# Patient Record
Sex: Male | Born: 1985 | Hispanic: No | Marital: Married | Smoking: Never smoker
Health system: Southern US, Community
[De-identification: ages and names within clinical notes are randomized; demographics above are authoritative.]

## PROBLEM LIST (undated history)

## (undated) DIAGNOSIS — Z789 Other specified health status: Secondary | ICD-10-CM

## (undated) HISTORY — PX: ABDOMINAL SURGERY: SHX537

---

## 2005-03-21 ENCOUNTER — Ambulatory Visit: Payer: Self-pay | Admitting: Cardiology

## 2005-03-21 ENCOUNTER — Encounter: Payer: Self-pay | Admitting: Cardiology

## 2005-03-21 ENCOUNTER — Ambulatory Visit (HOSPITAL_COMMUNITY): Admission: RE | Admit: 2005-03-21 | Discharge: 2005-03-21 | Payer: Self-pay | Admitting: Internal Medicine

## 2014-08-16 ENCOUNTER — Ambulatory Visit (INDEPENDENT_AMBULATORY_CARE_PROVIDER_SITE_OTHER): Payer: No Typology Code available for payment source | Admitting: Physician Assistant

## 2014-08-16 VITALS — BP 122/72 | HR 70 | Temp 98.1°F | Resp 16 | Ht 63.0 in | Wt 157.2 lb

## 2014-08-16 DIAGNOSIS — L259 Unspecified contact dermatitis, unspecified cause: Secondary | ICD-10-CM | POA: Diagnosis not present

## 2014-08-16 MED ORDER — TRIAMCINOLONE ACETONIDE 0.1 % EX CREA: 1.0000 "application " | TOPICAL_CREAM | Freq: Two times a day (BID) | CUTANEOUS | Status: AC

## 2014-08-16 NOTE — Progress Notes (Signed)
[facility] [address], [place] [id number] [phone]  Date:  08/16/2014   Name:  Timothy Barker   DOB:  [birth year]   MRN:  [id number]  PCP:  No primary care provider on file.    Chief Complaint: Rash   History of Present Illness:  Timothy Barker is a 29 y.o. very pleasant male patient who presents with the following:  Brother is present with patient and speaks sufficient Shakyla Nolley.   Patient reports a pruritic rash at left arm.  He states that this has been here for one week.  It itches only, and does not weep, swell, or have bumps.  He notes that at first it was the size of the tip of his pinky.  As he scratched, the redness began to enlarge.   Patient works as a Designer, fashion/clothingroofer.  He denies any recent wooded activity in the outdoors.  He denies any trauma or bites.  He states that he recently was wearing a band on his left arm for one week.  This apparently holds cards.  He can not tell me the make of the material.  He does not have any other known allergy to any metals or material that he knows.  He does not wear a belt.     There are no active problems to display for this patient.   History reviewed. No pertinent past medical history.  History reviewed. No pertinent past surgical history.  History  Substance Use Topics  . Smoking status: Never Smoker   . Smokeless tobacco: Not on file  . Alcohol Use: Not on file    History reviewed. No pertinent family history.  No Known Allergies  Medication list has been reviewed and updated.  No current outpatient prescriptions on file prior to visit.   No current facility-administered medications on file prior to visit.    Review of Systems: ROS ottherwise unremarkable unless listed above.    Physical Examination: Filed Vitals:   08/16/14 1105  BP: 122/72  Pulse: 70  Temp: 98.1 F (36.7 C)  Resp: 16   Filed Vitals:   08/16/14 1105  Height: 5\' 3"  (1.6 m)  Weight: 157 lb 3.2 oz (71.305 kg)   Body mass  index is 27.85 kg/(m^2). Ideal Body Weight: Weight in (lb) to have BMI = 25: 140.8  Physical Exam  Constitutional: He is oriented to person, place, and time. He appears well-developed and well-nourished. No distress.  HENT:  Head: Normocephalic and atraumatic.  Eyes: EOM are normal. Pupils are equal, round, and reactive to light.  Cardiovascular: Normal rate.   Pulmonary/Chest: No respiratory distress. He has no wheezes.  Neurological: He is alert and oriented to person, place, and time.  Skin: Rash (Erythematous non-raised patch along flexor elbow region of lateral arm.  There are no papules, or vesicles.   Patch with very specific erythematous demarcation throughout.  ) noted. He is not diaphoretic.  Psychiatric: He has a normal mood and affect. His behavior is normal.     Assessment and Plan: 29 year old male is here today with a rash without signs of infection.  Offending agent not recognized, but treating with steroid cream BID.  Advised to rtc if rash does not improve in 1 week.    Contact dermatitis - Plan: triamcinolone cream (KENALOG) 0.1 %  Trena PlattStephanie Maikol Grassia, PA-C [facility] 3/7/201611:43 AM

## 2014-08-16 NOTE — Patient Instructions (Signed)
Timothy Barker (Contact Dermatitis) Timothy Barker l ph?n ?ng v?i cc ch?t nh?t ??nh ti?p Barker v?i da. Timothy Barker c th? l Timothy da do ti?p Barker v?i ch?t gy kch thch ho?c Timothy da ti?p xc do d? ?ng. Timothy da do ti?p Barker v?i ch?t gy kch thch khng c?n ti?p Barker tr??c ? v?i ch?t gy ra ph?n ?ng. Timothy da ti?p xc do d? ?ng ch? x?y ra n?u b?n ? ti?p Barker v?i ch?t ? tr??c ?y. Khi ti?p Barker l?p l?i, c? th? c?a b?n s? ph?n ?ng v?i ch?t ?. NGUYN NHN Nhi?u ch?t c th? gy ra Timothy Barker. Timothy da do ch?t kch thch l nguyn nhn ph? bi?n nh?t do ti?p Barker l?p ?i l?p l?i v?i ch?t kch thch nh?, ch?ng h?n nh?:  Trang ?i?m.  X phng.  Ch?t t?y r?a.  Ch?t t?y tr?ng.  Axit.  Mu?i kim lo?i, ch?ng h?n nh? niken. Timothy da ti?p xc do d? ?ng ph? bi?n nh?t l do ti?p Barker v?i:  Th?c v?t ??c.  Ha ch?t (ch?t kh? mi, d?u g?i).  ?? trang s?c.  Jerrilyn [place] su.  Neomycin trong kem khng sinh ba tc d?ng.  Ch?t b?o qu?n trong cc s?n ph?m, bao g?m c? qu?n o. TRI?U CH?NG Vng da b? ti?p Barker c th? pht tri?n:  Kh ho?c bong v?y.  ??.  N?t.  Ng?a.  ?au ho?c c?m gic b?ng rt.  M?n n??c. Khi b? Timothy da ti?p xc do d? ?ng, c?ng c th? b? s?ng trong nh?ng vng nh? m m?t, mi?ng ho?c b? ph?n sinh d?c. CH?N ?ON Chuyn gia ch?m [place] s?c kh?e c?a b?n th??ng c th? Barker ??nh v?n ?? b?ng cch khm th?c th?Suzzette Righter tr??ng h?p khng ch?c ch?n nguyn nhn v nghi ng? b?nh Timothy da ti?p xc do d? ?ng, xt nghi?m mi?ng da c th? ???c th?c hi?n ?? gip Barker ??nh nguyn nhn Timothy da. ?I?U TR? ?i?u tr? bao g?m b?o v? da kh?i ti?p Barker thm v?i ch?t kch thch b?ng cch trnh cc ch?t ? n?u c th?. B?t, kem ch?n v g?ng tay c th? h?u ch. Chuyn gia ch?m [place] s?c kh?e c?a b?n c?ng c th? ?? ngh?:  Thoa kem ho?c thu?c m? steroid 2 l?n m?i ngy. ?? c k?t qu? t?t nh?t, hy ngm vng pht ban vo n??c l?nh trong 20 pht. Sau ? thoa thu?c. Che vng ny b?ng mi?ng nh?a. B?n c th? b?o qu?n kem  steroid trong t? l?nh ?? c hi?u ?ng "l?nh" cho ch? pht ban c?a b?n. Cch ? c th? lm gi?m ng?a. Thu?c steroid dng ?? u?ng c th? c?n thi?t trong tr??ng h?p n?ng.  Thu?c khng sinh ho?c thu?c m? khng khu?n n?u xu?t hi?n nhi?m trng da.  Dung d?ch thu?c khng histamin ho?c thu?c khng histamin d?ng u?ng ?? gi?m b?t ng?a.  D?u ?? gi? ?? ?m trong da.  Dung d?ch burow ?? gi?m t?y ?? v ?au nh?c ho?c ?? lm kh ban ?ang r? n??c. Tr?n m?t gi ho?c vin dung d?ch trong 2 chn n??c l?nh. Nhng m?t chi?c kh?n s?ch vo h?n h?p, v?t m?t cht v ??t n ln vng b? ?nh h??ng. ?? kh?n t?i ch? trong 30 pht. Lm ?i?u ny cng nhi?u l?n cng t?t trong su?t c? ngy.  T?m b?t b?p ho?c so?a bicacbonat hng ngy n?u vng ny l qu l?n ?? che b?ng kh?n m?t. Ha ch?t m?nh,  ch?ng h?n nh? ch?t ki?m ho?c axit, c th? gy t?n th??ng da gi?ng nh? b?ng. B?n c?n x? n??c vo da mnh t? 15 ??n 20 pht b?ng n??c l?nh sau khi ti?p Barker. B?n c?ng nn ?i khm ngay l?p t?c sau khi ti?p Barker. B?ng, thu?c khng sinh v thu?c gi?m ?au c th? c?n thi?t cho da b? kch thch n?ng. H??NG D?N CH?M [place] T?I NH  Trnh ch?t gy ra ph?n ?ng.  Gi? cho vng da b? ?nh h??ng trnh xa n??c nng, x phng, nh sng m?t tr?i, ha ch?t, cc ch?t c tnh axit ho?c b?t c? th? g khc c th? gy kch ?ng da c?a b?n.  Khng gi ch? pht ban. Gi c th? khi?n cho ch? pht ban b? nhi?m trng.  B?n c th? t?m n??c mt ?? gip gi?m ng?a.  Ch? s? d?ng thu?c khng c?n k toa ho?c thu?c c?n k toa theo ch? d?n c?a chuyn gia ch?m [place] s?c kh?e.  G?p chuyn gia ch?m [place] s?c kh?e c?a b?n ?? khm l?i theo ch? d?n ?? ??m b?o da c?a b?n li?n ?ng cch. HY ?I KHM N?U:  Tnh tr?ng c?a b?n khng c?i thi?n sau 3 ngy ?i?u tr?Marland Kitchen.  B?n d??ng nh? tr? nn t? h?n.  B?n th?y c d?u hi?u nhi?m trng nh? s?ng, nh?y c?m ?au, ??, ?au nh?c ho?c ?m ? vng b? ?nh h??ng.  B?n c b?t k? v?n ?? no lin quan ??n thu?c. Document Released: 03/07/2005 Document Revised:  01/28/2013 [facility] Patient Information 2015 [place], [place]. This information is not intended to replace advice given to you by your health care provider. Make sure you discuss any questions you have with your health care provider.

## 2014-10-01 ENCOUNTER — Ambulatory Visit (INDEPENDENT_AMBULATORY_CARE_PROVIDER_SITE_OTHER): Payer: No Typology Code available for payment source | Admitting: Urgent Care

## 2014-10-01 VITALS — BP 124/76 | HR 63 | Temp 98.2°F | Resp 17 | Ht 63.5 in | Wt 154.0 lb

## 2014-10-01 DIAGNOSIS — R0981 Nasal congestion: Secondary | ICD-10-CM | POA: Diagnosis not present

## 2014-10-01 DIAGNOSIS — J309 Allergic rhinitis, unspecified: Secondary | ICD-10-CM | POA: Diagnosis not present

## 2014-10-01 DIAGNOSIS — R51 Headache: Secondary | ICD-10-CM

## 2014-10-01 DIAGNOSIS — R519 Headache, unspecified: Secondary | ICD-10-CM

## 2014-10-01 LAB — POCT CBC
GRANULOCYTE PERCENT: 67.7 % (ref 37–80)
HEMATOCRIT: 45.6 % (ref 43.5–53.7)
Hemoglobin: 15.1 g/dL (ref 14.1–18.1)
Lymph, poc: 2.9 (ref 0.6–3.4)
MCH: 25.4 pg — AB (ref 27–31.2)
MCHC: 33 g/dL (ref 31.8–35.4)
MCV: 76.8 fL — AB (ref 80–97)
MID (CBC): 0.3 (ref 0–0.9)
MPV: 8.1 fL (ref 0–99.8)
PLATELET COUNT, POC: 244 10*3/uL (ref 142–424)
POC Granulocyte: 6.7 (ref 2–6.9)
POC LYMPH PERCENT: 28.9 %L (ref 10–50)
POC MID %: 3.4 %M (ref 0–12)
RBC: 5.94 M/uL (ref 4.69–6.13)
RDW, POC: 14.3 %
WBC: 9.9 10*3/uL (ref 4.6–10.2)

## 2014-10-01 MED ORDER — FLUTICASONE PROPIONATE 50 MCG/ACT NA SUSP: 2.0000 | Freq: Every day | NASAL | Status: DC

## 2014-10-01 MED ORDER — CETIRIZINE HCL 10 MG PO TABS: 10.0000 mg | ORAL_TABLET | Freq: Every day | ORAL | Status: DC

## 2014-10-01 MED ORDER — PSEUDOEPHEDRINE HCL ER 120 MG PO TB12: 120.0000 mg | ORAL_TABLET | Freq: Two times a day (BID) | ORAL | Status: DC

## 2014-10-01 NOTE — Progress Notes (Signed)
    MRN: [id number] DOB: 1986/01/25  Subjective:   Timothy Barker is a 29 y.o. male presenting for chief complaint of Nasal Congestion; Cough; and Headache  Reports 1 month history of nasal congestion, sneezing, headache, subjective fever. Has tried otc nasal spray (cannot recall name) with minimal relief. Denies sinus pain, itchy or watery red eyes, ear pain, ear fullness, throat pain, chest pain, chest tightness, shob, wheezing, n/v, abdominal pain, diarrhea, myalgia, fatigue. Has not had any sick contacts. Denies history of seasonal allergies or asthma. Did not take flu shot this season. Denies smoking or alcohol use. Denies any other aggravating or relieving factors, no other questions or concerns.  Raheem currently has no medications in their medication list. He has No Known Allergies.  Rahil  has no past medical history on file. Also  has no past surgical history on file.  ROS As in subjective.  Objective:   Vitals: BP 124/76 mmHg  Pulse 63  Temp(Src) 98.2 F (36.8 C) (Oral)  Resp 17  Ht 5' 3.5" (1.613 m)  Wt 154 lb (69.854 kg)  BMI 26.85 kg/m2  SpO2 100%  Physical Exam  Constitutional: He is well-developed, well-nourished, and in no distress.  HENT:  TM's flat bilaterally, no effusions or erythema. Nasal turbinates boggy, inflamed and edematous with yellow-white mucus. Frontal sinus tenderness. Postnasal drip present, without oropharyngeal exudates, erythema or abscesses.  Eyes: Right eye exhibits no discharge. Left eye exhibits no discharge. No scleral icterus.  Conjunctiva slightly injected bilaterally.  Neck: Normal range of motion.  Cardiovascular: Normal rate, regular rhythm and intact distal pulses.  Exam reveals no gallop and no friction rub.   No murmur heard. Pulmonary/Chest: No stridor. No respiratory distress. He has no wheezes. He has no rales. He exhibits no tenderness.  Lymphadenopathy:    He has no cervical adenopathy.   Results for orders placed or  performed in visit on 10/01/14 (from the past 24 hour(s))  POCT CBC     Status: Abnormal   Collection Time: 10/01/14 12:15 PM  Result Value Ref Range   WBC 9.9 4.6 - 10.2 K/uL   Lymph, poc 2.9 0.6 - 3.4   POC LYMPH PERCENT 28.9 10 - 50 %L   MID (cbc) 0.3 0 - 0.9   POC MID % 3.4 0 - 12 %M   POC Granulocyte 6.7 2 - 6.9   Granulocyte percent 67.7 37 - 80 %G   RBC 5.94 4.69 - 6.13 M/uL   Hemoglobin 15.1 14.1 - 18.1 g/dL   HCT, POC 40.945.6 81.143.5 - 53.7 %   MCV 76.8 (A) 80 - 97 fL   MCH, POC 25.4 (A) 27 - 31.2 pg   MCHC 33.0 31.8 - 35.4 g/dL   RDW, POC 91.414.3 %   Platelet Count, POC 244 142 - 424 K/uL   MPV 8.1 0 - 99.8 fL   Assessment and Plan :   1. Nasal congestion 2. Sinus headache 3. Allergic rhinitis, unspecified allergic rhinitis type - Will start allergy management, advised that if he does not get better in 1 week to call and let me know, we can start antibiotic then for sinusitis likely due to uncontrolled allergies but for now will manage conservatively  Wallis BambergMario Zofia Peckinpaugh, PA-C [facility] [phone] 10/01/2014 11:37 AM

## 2014-10-01 NOTE — Patient Instructions (Addendum)
B?nh s?t mùa c? khô °(Hay Fever) °B?nh s?t mùa c? khô là ph?n ?ng d? ?ng v?i các h?t trong không khí. B?nh không lây t? ng??i sang ng??i. Không th? ch?a kh?i h?n b?nh này nh?ng có th? ki?m soát ???c.  °NGUYÊN NHÂN °B?nh s?t mùa c? khô gây b?i cái gì ?ó làm kh?i phát ph?n ?ng d? ?ng (d? nguyên). Sau ?ây là nh?ng ví d? v? các d? nguyên: °· C? ph?n h??ng. °· Lông. °· Gàu c?a ??ng v?t. °· C? và ph?n hoa. °· Khói thu?c lá. °· B?i trong nhà. °· Ô nhi?m. °TRI?U CH?NG °· H?t h?i. °· Ch?y n??c m?i ho?c ng?t m?i. °· Ch?y n??c m?t. °· M?t, m?i, mi?ng, h?ng, da ho?c khu v?c khác b? ng?a. °· ?au h?ng. °· ?au ??u. °· Gi?m c?m giác kh?u giác ho?c v? giác. °CH?N ?OÁN °Chuyên gia ch?m sóc s?c kh?e s? khám th?c th? và h?i v? các tri?u ch?ng b?n ?ang có. Xét nghi?m v? d? ?ng có th? ???c th?c hi?n ?? xác ??nh chính xác nguyên nhân kh?i phát b?nh s?t c? mùa khô c?a b?n. °?I?U TR? °· Thu?c không c?n kê toa có th? giúp gi?m các tri?u ch?ng. Các thu?c này bao g?m: °¨ Thu?c kháng histamine. °¨ Thu?c gi?m ng?t m?i. Thu?c này có th? giúp gi?m ngh?t m?i. °· N?u nh?ng thu?c không c?n kê toa không có tác d?ng, chuyên gia ch?m sóc s?c kh?e có th? kê ??n các lo?i thu?c khác. °· M?t s? ng??i h??ng l?i t? vi?c chích ng?a d? ?ng khi các lo?i thu?c khác không có tác d?ng. °H??NG D?N CH?M SÓC T?I NHÀ °· Tránh d? nguyên gây ra các tri?u ch?ng c?a b?n, n?u có th?. °· S? d?ng t?t c? thu?c theo ch? d?n c?a chuyên gia ch?m sóc s?c kh?e. °HÃY ?I KHÁM N?U: °· B?n có các tri?u ch?ng d? ?ng nghiêm tr?ng và các lo?i thu?c hi?n t?i c?a b?n không có tác d?ng. °· Vi?c ?i?u tr? có lúc có hi?u qu?, nh?ng bây gi? b?n ?ang có các tri?u ch?ng. °· B?n b? sung huy?t và b? áp l?c trong xoang. °· B?n b? s?t ho?c ?au ??u. °· B?n b? ch?y n??c m?i ??c. °· B?n b? hen suy?n và b? ho và th? khò khè n?ng thêm. °HÃY NGAY L?P T?C ?I KHÁM N?U: °· B?n b? s?ng l??i ho?c môi. °· B?n b? khó th?. °· B?n c?m th?y choáng váng ho?c c?m th?y nh? s?p ng?t. °· B?n ?? m? hôi l?nh. °· B?n b?  s?t. °Document Released: 05/28/2005 Document Revised: 01/28/2013 °ExitCare® Patient Information ©2015 ExitCare, LLC. This information is not intended to replace advice given to you by your health care provider. Make sure you discuss any questions you have with your health care provider. ° °

## 2019-04-29 ENCOUNTER — Other Ambulatory Visit: Payer: Self-pay

## 2019-04-29 ENCOUNTER — Encounter (HOSPITAL_COMMUNITY): Payer: Self-pay

## 2019-04-29 ENCOUNTER — Ambulatory Visit (HOSPITAL_COMMUNITY)
Admission: EM | Admit: 2019-04-29 | Discharge: 2019-04-29 | Disposition: A | Discharge status: Home / Self Care | Payer: No Typology Code available for payment source | Attending: Family Medicine | Admitting: Family Medicine

## 2019-04-29 DIAGNOSIS — R1012 Left upper quadrant pain: Secondary | ICD-10-CM | POA: Diagnosis not present

## 2019-04-29 LAB — POCT URINALYSIS DIP (DEVICE)
Bilirubin Urine: NEGATIVE
Glucose, UA: NEGATIVE mg/dL
Ketones, ur: NEGATIVE mg/dL
Leukocytes,Ua: NEGATIVE
Nitrite: NEGATIVE
Protein, ur: NEGATIVE mg/dL
Specific Gravity, Urine: >=1.03 (ref 1.005–1.030)
Urobilinogen, UA: 0.2 mg/dL (ref 0.0–1.0)
pH: 5.5 (ref 5.0–8.0)

## 2019-04-29 MED ORDER — POLYETHYLENE GLYCOL 3350 17 G PO PACK: 17.0000 g | PACK | Freq: Every day | ORAL | 0 refills | Status: DC

## 2019-04-29 MED ORDER — DOCUSATE SODIUM 100 MG PO CAPS: 100.0000 mg | ORAL_CAPSULE | Freq: Two times a day (BID) | ORAL | 0 refills | Status: DC

## 2019-04-29 NOTE — ED Triage Notes (Addendum)
Pt states he has abdominal pian. Pt states he has a little bite of a bowel movement. This ahs been going on for 3 days.

## 2019-04-29 NOTE — Discharge Instructions (Signed)
Please use Miralax for moderate to severe constipation. Take this once a day for the next 2-3 days. Please also start docusate stool softener, twice a day for at least 1 week. If stools become loose, cut down to once a day for another week. If stools remain loose, cut back to 1 pill every other day for a third week. You can stop docusate thereafter and resume as needed for constipation. ° °To help reduce constipation and promote bowel health: °1. Drink at least 64 ounces of water each day °2. Eat plenty of fiber (fruits, vegetables, whole grains, legumes) °3. Be physically active or exercise including walking, jogging, swimming, yoga, etc. °4. For active constipation use a stool softener (docusate) or an osmotic laxative (like Miralax) each day, or as needed.  °

## 2019-04-29 NOTE — ED Provider Notes (Signed)
[facility]    CSN: [id number] Arrival date & time: 04/29/19  1007      History   Chief Complaint Chief Complaint  Patient presents with  . Abdominal Pain    HPI Timothy Barker is a 33 y.o. male is significant past medical history presenting today for evaluation of abdominal pain.  Patient states that he has had left lower abdominal pain for the past 3 days.  He rates pain 6 out of 10.  He denies associated nausea or vomiting.  Symptoms began after drinking Sprite.  Does feel worsening symptoms after eating.  Described pain as a bloated sensation.  He denies any diarrhea.  Has had daily bowel movements, but states that they have been smaller than normal.  Denies straining.  Denies history of constipation.  Denies previous surgeries on abdomen.  Does feel pain in his back, but does not radiate to the groin.  Denies testicular pain or swelling.  Denies any urinary symptoms of dysuria, hematuria.  Denies fevers.  HPI  History reviewed. No pertinent past medical history.  There are no active problems to display for this patient.   History reviewed. No pertinent surgical history.     Home Medications    Prior to Admission medications   Medication Sig Start Date End Date Taking? Authorizing Provider  cetirizine (ZYRTEC) 10 MG tablet Take 1 tablet (10 mg total) by mouth daily. 10/01/14   Wallis Bamberg, PA-C  docusate sodium (COLACE) 100 MG capsule Take 1 capsule (100 mg total) by mouth every 12 (twelve) hours. 04/29/19   Wieters, Hallie C, PA-C  fluticasone (FLONASE) 50 MCG/ACT nasal spray Place 2 sprays into both nostrils daily. 10/01/14   Wallis Bamberg, PA-C  polyethylene glycol (MIRALAX / GLYCOLAX) 17 g packet Take 17 g by mouth daily. 04/29/19   Wieters, Hallie C, PA-C  pseudoephedrine (SUDAFED 12 HOUR) 120 MG 12 hr tablet Take 1 tablet (120 mg total) by mouth 2 (two) times daily. 10/01/14   Wallis Bamberg, PA-C    Family History Family History  Problem Relation Age of Onset   . Healthy Mother   . Healthy Father     Social History Social History   Tobacco Use  . Smoking status: Never Smoker  . Smokeless tobacco: Never Used  Substance Use Topics  . Alcohol use: Never    Alcohol/week: 0.0 standard drinks    Frequency: Never  . Drug use: Never     Allergies   Patient has no known allergies.   Review of Systems Review of Systems  Constitutional: Negative for fever.  HENT: Negative for sore throat.   Respiratory: Negative for shortness of breath.   Cardiovascular: Negative for chest pain.  Gastrointestinal: Positive for abdominal pain and constipation. Negative for nausea and vomiting.  Genitourinary: Negative for difficulty urinating, discharge, dysuria, frequency, penile pain, penile swelling, scrotal swelling and testicular pain.  Skin: Negative for rash.  Neurological: Negative for dizziness, light-headedness and headaches.     Physical Exam Triage Vital Signs ED Triage Vitals  Enc Vitals Group     BP 04/29/19 1024 126/77     Pulse Rate 04/29/19 1024 78     Resp 04/29/19 1024 16     Temp 04/29/19 1024 98.6 F (37 C)     Temp Source 04/29/19 1024 Oral     SpO2 04/29/19 1024 100 %     Weight 04/29/19 1041 145 lb (65.8 kg)     Height --  Head Circumference --      Peak Flow --      Pain Score 04/29/19 1041 4     Pain Loc --      Pain Edu? --      Excl. in GC? --    No data found.  Updated Vital Signs BP 126/77 (BP Location: Left Arm)   Pulse 78   Temp 98.6 F (37 C) (Oral)   Resp 16   Wt 145 lb (65.8 kg)   SpO2 100%   BMI 25.28 kg/m   Visual Acuity Right Eye Distance:   Left Eye Distance:   Bilateral Distance:    Right Eye Near:   Left Eye Near:    Bilateral Near:     Physical Exam Vitals signs and nursing note reviewed.  Constitutional:      Appearance: He is well-developed.     Comments: No acute distress, sitting comfortably on exam table  HENT:     Head: Normocephalic and atraumatic.  Eyes:      Conjunctiva/sclera: Conjunctivae normal.  Neck:     Musculoskeletal: Neck supple.  Cardiovascular:     Rate and Rhythm: Normal rate and regular rhythm.     Heart sounds: No murmur.  Pulmonary:     Effort: Pulmonary effort is normal. No respiratory distress.     Breath sounds: Normal breath sounds.     Comments: Breathing comfortably at rest, CTABL, no wheezing, rales or other adventitious sounds auscultated Abdominal:     Palpations: Abdomen is soft.     Tenderness: There is abdominal tenderness.     Comments: Abdomen soft, nondistended, bowel sounds present throughout all 4 quadrants, dull to percussion in upper abdomen, tender to palpation in left lower quadrant, negative rebound, negative Rovsing, negative McBurney, no CVA tenderness  Musculoskeletal:     Comments: Ambulating normally, moving around easily without appearing uncomfortable  Skin:    General: Skin is warm and dry.  Neurological:     Mental Status: He is alert.      UC Treatments / Results  Labs (all labs ordered are listed, but only abnormal results are displayed) Labs Reviewed  POCT URINALYSIS DIP (DEVICE) - Abnormal; Notable for the following components:      Result Value   Hgb urine dipstick TRACE (*)    All other components within normal limits    EKG   Radiology No results found.  Procedures Procedures (including critical care time)  Medications Ordered in UC Medications - No data to display  Initial Impression / Assessment and Plan / UC Course  I have reviewed the triage vital signs and the nursing notes.  Pertinent labs & imaging results that were available during my care of the patient were reviewed by me and considered in my medical decision making (see chart for details).     UA with negative leuks and nitrites, is trace hemoglobin, symptoms most consistent with constipation.  Did not suspect obstruction at this time as he is continuing to move bowels.  Recommended lifestyle changes of  increasing water, activity and fiber.  Provided MiraLAX and Colace to use for the next week and then titrate down as needed.  Continue to monitor,Discussed strict return precautions. Patient verbalized understanding and is agreeable with plan.  Final Clinical Impressions(s) / UC Diagnoses   Final diagnoses:  Left upper quadrant abdominal pain     Discharge Instructions     Please use Miralax for moderate to severe constipation. Take this once a day  for the next 2-3 days. Please also start docusate stool softener, twice a day for at least 1 week. If stools become loose, cut down to once a day for another week. If stools remain loose, cut back to 1 pill every other day for a third week. You can stop docusate thereafter and resume as needed for constipation.  To help reduce constipation and promote bowel health: 1. Drink at least 64 ounces of water each day 2. Eat plenty of fiber (fruits, vegetables, whole grains, legumes) 3. Be physically active or exercise including walking, jogging, swimming, yoga, etc. 4. For active constipation use a stool softener (docusate) or an osmotic laxative (like Miralax) each day, or as needed.   ED Prescriptions    Medication Sig Dispense Auth. Provider   polyethylene glycol (MIRALAX / GLYCOLAX) 17 g packet Take 17 g by mouth daily. 14 each Wieters, Hallie C, PA-C   docusate sodium (COLACE) 100 MG capsule Take 1 capsule (100 mg total) by mouth every 12 (twelve) hours. 60 capsule Wieters, [place] C, PA-C     PDMP not reviewed this encounter.   Janith Lima, PA-C 04/29/19 1124

## 2020-03-09 ENCOUNTER — Emergency Department (HOSPITAL_COMMUNITY): Payer: No Typology Code available for payment source

## 2020-03-09 ENCOUNTER — Encounter (HOSPITAL_COMMUNITY): Payer: Self-pay | Admitting: Emergency Medicine

## 2020-03-09 ENCOUNTER — Emergency Department (HOSPITAL_COMMUNITY)
Admission: EM | Admit: 2020-03-09 | Discharge: 2020-03-10 | Disposition: A | Discharge status: Home / Self Care | Payer: No Typology Code available for payment source | Attending: Emergency Medicine | Admitting: Emergency Medicine

## 2020-03-09 DIAGNOSIS — R091 Pleurisy: Secondary | ICD-10-CM | POA: Insufficient documentation

## 2020-03-09 LAB — BASIC METABOLIC PANEL
Anion gap: 10 (ref 5–15)
BUN: 13 mg/dL (ref 6–20)
CO2: 24 mmol/L (ref 22–32)
Calcium: 8.9 mg/dL (ref 8.9–10.3)
Chloride: 100 mmol/L (ref 98–111)
Creatinine, Ser: 0.94 mg/dL (ref 0.61–1.24)
GFR calc Af Amer: >60 mL/min (ref >60)
GFR calc non Af Amer: >60 mL/min (ref >60)
Glucose, Bld: 129 mg/dL — ABNORMAL HIGH (ref 70–99)
Potassium: 3.6 mmol/L (ref 3.5–5.1)
Sodium: 134 mmol/L — ABNORMAL LOW (ref 135–145)

## 2020-03-09 LAB — TROPONIN I (HIGH SENSITIVITY): Troponin I (High Sensitivity): 3 ng/L (ref <18)

## 2020-03-09 LAB — CBC
HCT: 42.7 % (ref 39.0–52.0)
Hemoglobin: 14.3 g/dL (ref 13.0–17.0)
MCH: 25.3 pg — ABNORMAL LOW (ref 26.0–34.0)
MCHC: 33.5 g/dL (ref 30.0–36.0)
MCV: 75.4 fL — ABNORMAL LOW (ref 80.0–100.0)
Platelets: 266 10*3/uL (ref 150–400)
RBC: 5.66 MIL/uL (ref 4.22–5.81)
RDW: 14.2 % (ref 11.5–15.5)
WBC: 7.7 10*3/uL (ref 4.0–10.5)
nRBC: 0 % (ref 0.0–0.2)

## 2020-03-09 NOTE — ED Triage Notes (Signed)
Pt here from home with c/o right side chest pain and sob that radiates to his back on going for 2 weeks

## 2020-03-09 NOTE — ED Notes (Signed)
Called for vitals and no response 

## 2020-03-10 LAB — D-DIMER, QUANTITATIVE: D-Dimer, Quant: <0.27 ug/mL-FEU (ref 0.00–0.50)

## 2020-03-10 LAB — TROPONIN I (HIGH SENSITIVITY): Troponin I (High Sensitivity): 4 ng/L (ref <18)

## 2020-03-10 MED ORDER — IBUPROFEN 800 MG PO TABS
800.0000 mg | ORAL_TABLET | Freq: Once | ORAL | Status: AC
  Administered 2020-03-10: 800 mg via ORAL
  Filled 2020-03-10: qty 1

## 2020-03-10 MED ORDER — IBUPROFEN 800 MG PO TABS: 800.0000 mg | ORAL_TABLET | Freq: Three times a day (TID) | ORAL | 0 refills | Status: DC | PRN

## 2020-03-10 NOTE — Discharge Instructions (Addendum)
Follow-up with your family doctor in 1 to 2 weeks if not improving 

## 2020-03-10 NOTE — ED Provider Notes (Signed)
[facility] EMERGENCY DEPARTMENT Provider Note   CSN: [id number] Arrival date & time: 03/09/20  1310     History No chief complaint on file.   Timothy Barker is a 34 y.o. male.  Patient complains of right-sided chest pain is worse with inspiration for a number days.  No fever sweating cough  The history is provided by the patient. No language interpreter was used.  Chest Pain Pain location:  R chest Pain quality: aching   Pain radiates to:  Does not radiate Pain severity:  Moderate Onset quality:  Sudden Timing:  Constant Progression:  Waxing and waning Chronicity:  New Context: breathing   Relieved by:  Nothing Associated symptoms: no abdominal pain, no back pain, no cough, no fatigue and no headache        History reviewed. No pertinent past medical history.  There are no problems to display for this patient.   History reviewed. No pertinent surgical history.     Family History  Problem Relation Age of Onset  . Healthy Mother   . Healthy Father     Social History   Tobacco Use  . Smoking status: Never Smoker  . Smokeless tobacco: Never Used  Substance Use Topics  . Alcohol use: Never    Alcohol/week: 0.0 standard drinks  . Drug use: Never    Home Medications Prior to Admission medications   Medication Sig Start Date End Date Taking? Authorizing Provider  cetirizine (ZYRTEC) 10 MG tablet Take 1 tablet (10 mg total) by mouth daily. 10/01/14   Wallis Bamberg, PA-C  docusate sodium (COLACE) 100 MG capsule Take 1 capsule (100 mg total) by mouth every 12 (twelve) hours. 04/29/19   Wieters, Hallie C, PA-C  fluticasone (FLONASE) 50 MCG/ACT nasal spray Place 2 sprays into both nostrils daily. 10/01/14   Wallis Bamberg, PA-C  ibuprofen (ADVIL) 800 MG tablet Take 1 tablet (800 mg total) by mouth every 8 (eight) hours as needed for moderate pain. 03/10/20   Bethann Berkshire, MD  polyethylene glycol (MIRALAX / GLYCOLAX) 17 g packet Take 17 g by mouth daily.  04/29/19   Wieters, Hallie C, PA-C  pseudoephedrine (SUDAFED 12 HOUR) 120 MG 12 hr tablet Take 1 tablet (120 mg total) by mouth 2 (two) times daily. 10/01/14   Wallis Bamberg, PA-C    Allergies    Patient has no known allergies.  Review of Systems   Review of Systems  Constitutional: Negative for appetite change and fatigue.  HENT: Negative for congestion, ear discharge and sinus pressure.   Eyes: Negative for discharge.  Respiratory: Negative for cough.   Cardiovascular: Positive for chest pain.  Gastrointestinal: Negative for abdominal pain and diarrhea.  Genitourinary: Negative for frequency and hematuria.  Musculoskeletal: Negative for back pain.  Skin: Negative for rash.  Neurological: Negative for seizures and headaches.  Psychiatric/Behavioral: Negative for hallucinations.    Physical Exam Updated Vital Signs BP 135/84   Pulse 89   Temp 98.2 F (36.8 C) (Oral)   Resp 20   SpO2 100%   Physical Exam Vitals and nursing note reviewed.  Constitutional:      Appearance: He is well-developed.  HENT:     Head: Normocephalic.     Nose: Nose normal.  Eyes:     General: No scleral icterus.    Conjunctiva/sclera: Conjunctivae normal.  Neck:     Thyroid: No thyromegaly.  Cardiovascular:     Rate and Rhythm: Normal rate and regular rhythm.  Heart sounds: No murmur heard.  No friction rub. No gallop.   Pulmonary:     Breath sounds: No stridor. No wheezing or rales.  Chest:     Chest wall: No tenderness.  Abdominal:     General: There is no distension.     Tenderness: There is no abdominal tenderness. There is no rebound.  Musculoskeletal:        General: Normal range of motion.     Cervical back: Neck supple.  Lymphadenopathy:     Cervical: No cervical adenopathy.  Skin:    Findings: No erythema or rash.  Neurological:     Mental Status: He is alert and oriented to person, place, and time.     Motor: No abnormal muscle tone.     Coordination: Coordination  normal.  Psychiatric:        Behavior: Behavior normal.     ED Results / Procedures / Treatments   Labs (all labs ordered are listed, but only abnormal results are displayed) Labs Reviewed  BASIC METABOLIC PANEL - Abnormal; Notable for the following components:      Result Value   Sodium 134 (*)    Glucose, Bld 129 (*)    All other components within normal limits  CBC - Abnormal; Notable for the following components:   MCV 75.4 (*)    MCH 25.3 (*)    All other components within normal limits  D-DIMER, QUANTITATIVE (NOT AT [facility])  TROPONIN I (HIGH SENSITIVITY)  TROPONIN I (HIGH SENSITIVITY)    EKG EKG Interpretation  Date/Time:  Wednesday March 09 2020 13:26:52 EDT Ventricular Rate:  82 PR Interval:  152 QRS Duration: 94 QT Interval:  346 QTC Calculation: 404 R Axis:   52 Text Interpretation: Normal sinus rhythm Nonspecific T wave abnormality Abnormal ECG No old tracing to compare Confirmed by Marily Memos [phone]) on 03/10/2020 2:26:50 AM   Radiology DG Chest 2 View  Result Date: 03/09/2020 CLINICAL DATA:  Chest pain and shortness of breath EXAM: CHEST - 2 VIEW COMPARISON:  None. FINDINGS: There is minimal left base atelectasis. The lungs elsewhere are clear. Heart is upper normal in size with pulmonary vascularity normal. No adenopathy. No bone lesions. IMPRESSION: Slight left base atelectasis. Lungs otherwise clear. Heart upper normal in size. Electronically Signed   By: Bretta Bang III M.D.   On: 03/09/2020 13:50    Procedures Procedures (including critical care time)  Medications Ordered in ED Medications  ibuprofen (ADVIL) tablet 800 mg (800 mg Oral Given 03/10/20 1017)    ED Course  I have reviewed the triage vital signs and the nursing notes.  Pertinent labs & imaging results that were available during my care of the patient were reviewed by me and considered in my medical decision making (see chart for details).    MDM Rules/Calculators/A&P                          Labs and chest x-ray EKG all unremarkable.  Patient symptoms improved with Motrin.  Suspect pleuritis.  He will follow-up as needed and is given Motrin for discomfort      This patient presents to the ED for concern of chest pain, this involves an extensive number of treatment options, and is a complaint that carries with it a high risk of complications and morbidity.  The differential diagnosis includes PE MI   Lab Tests:   I Ordered, reviewed, and interpreted labs, which included CBC chemistries  D-dimer all unremarkable  Medicines ordered:   I ordered medication Motrin for pain  Imaging Studies ordered:   I ordered imaging studies which included chest x-ray  I independently visualized and interpreted imaging which showed negative  Additional history obtained:   Additional history obtained from record  Previous records obtained and reviewed.  Consultations Obtained:     Reevaluation:  After the interventions stated above, I reevaluated the patient and found improvement  Critical Interventions:  .   Final Clinical Impression(s) / ED Diagnoses Final diagnoses:  Pleuritis    Rx / DC Orders ED Discharge Orders         Ordered    ibuprofen (ADVIL) 800 MG tablet  Every 8 hours PRN        03/10/20 1152           Bethann Berkshire, MD 03/10/20 1158

## 2020-03-10 NOTE — ED Notes (Signed)
D/C instructions reviewed with pt. Pt verbalized understanding of DC instructions. PT DC

## 2021-05-20 IMAGING — DX DG CHEST 2V
2 series · 2 of 2 positions shown · non-contrast
Comparison: None.

CLINICAL DATA: Chest pain and shortness of breath

EXAM:
CHEST - 2 VIEW

[chest pa]
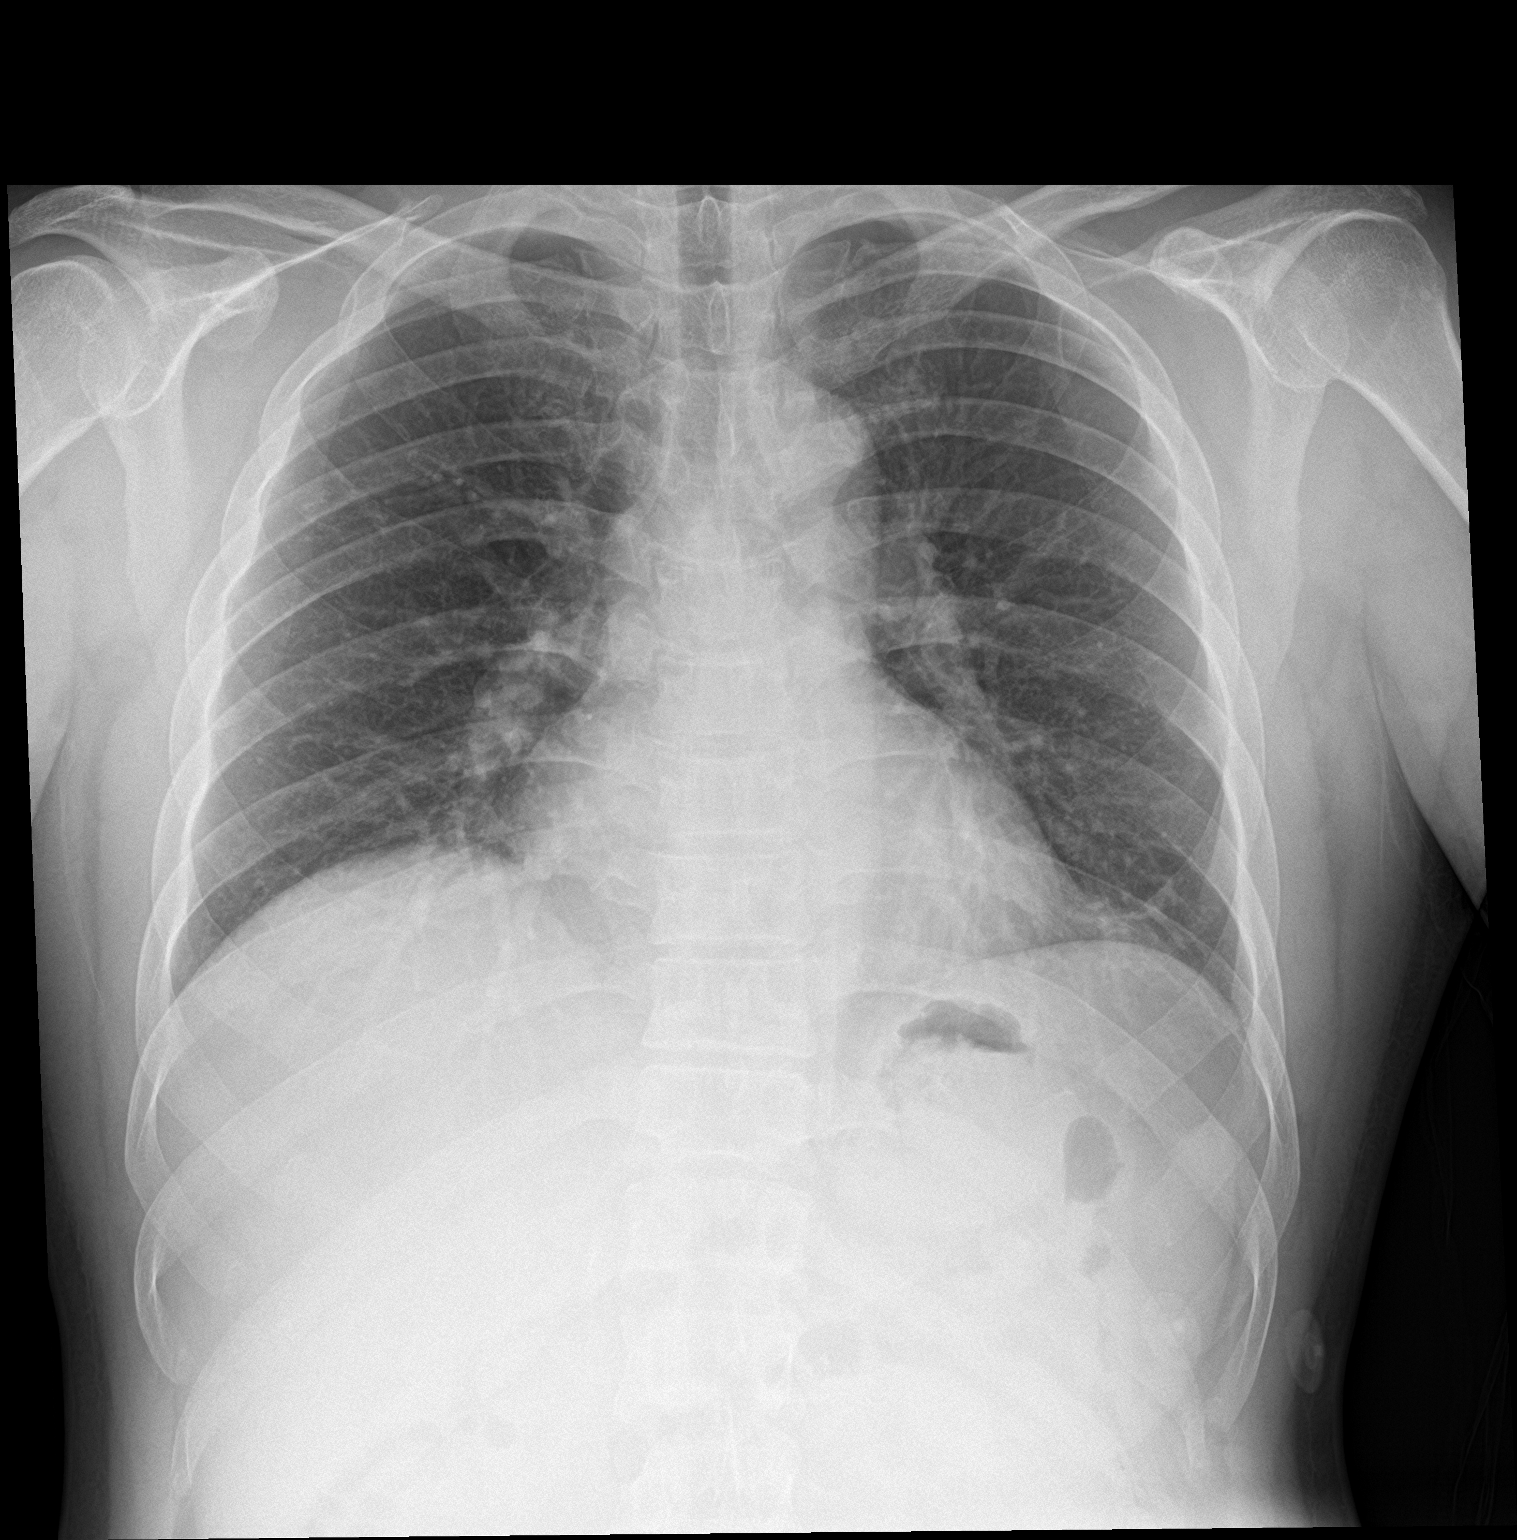

[chest lat]
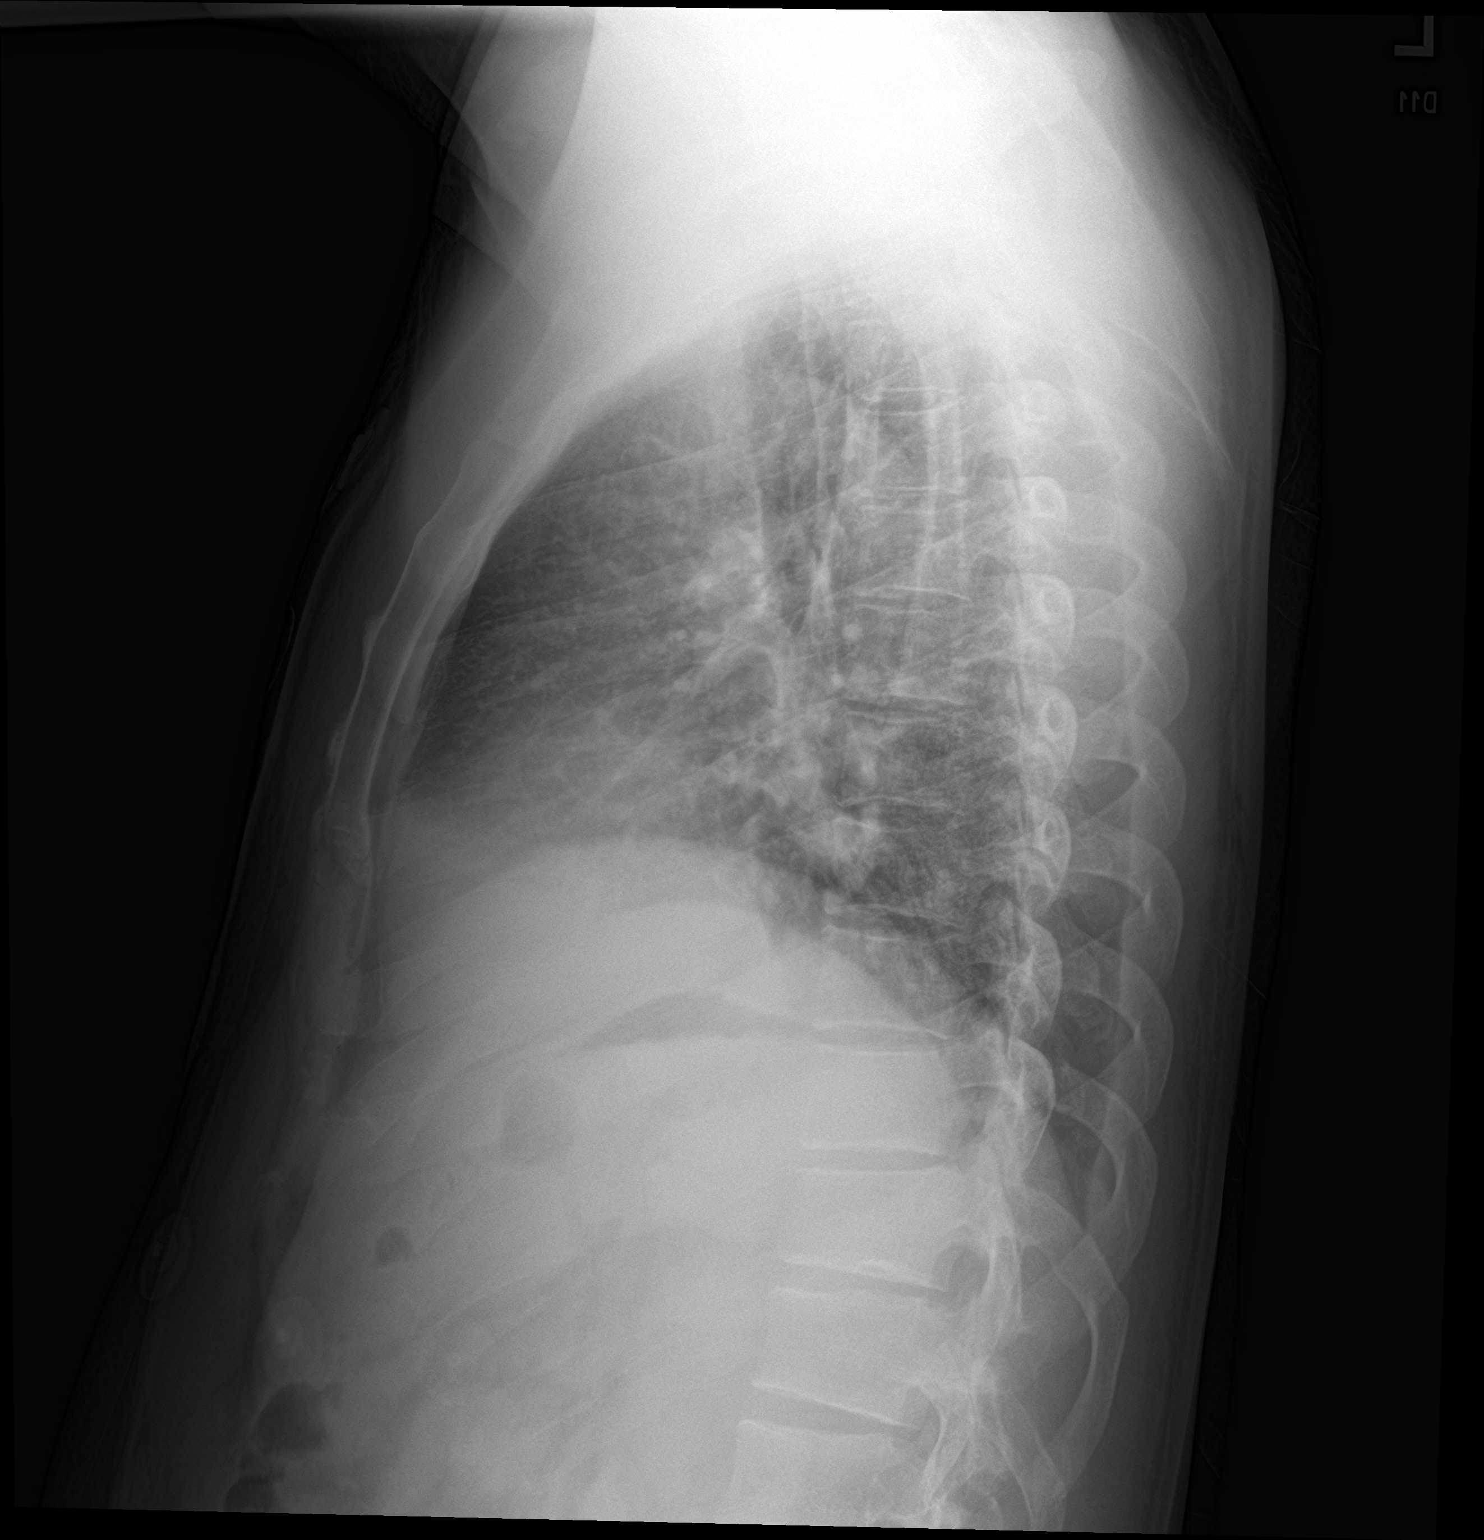

[2 of 2 positions shown; findings below may reference images not displayed]

FINDINGS: There is minimal left base atelectasis. The lungs elsewhere are
clear. Heart is upper normal in size with pulmonary vascularity
normal. No adenopathy. No bone lesions.
IMPRESSION: Slight left base atelectasis. Lungs otherwise clear. Heart upper
normal in size.

## 2023-02-14 ENCOUNTER — Other Ambulatory Visit: Payer: Self-pay | Admitting: Neurological Surgery

## 2023-02-18 ENCOUNTER — Encounter (HOSPITAL_COMMUNITY): Payer: Self-pay | Admitting: Neurological Surgery

## 2023-02-18 ENCOUNTER — Other Ambulatory Visit: Payer: Self-pay | Admitting: Neurological Surgery

## 2023-02-18 NOTE — Progress Notes (Signed)
SDW call  Patient's brother, Timothy Barker was given pre-op instructions over the phone. He is fluent in [place] and verbalized understanding of instructions provided.     PCP - Denies Cardiologist -  Pulmonary:    PPM/ICD - denies Device Orders - n/a Rep Notified - n/a   Chest x-ray - n/a EKG -  n/a Stress Test - ECHO -  Cardiac Cath -   Sleep Study/sleep apnea/CPAP: denies  Non-daibetic   Blood Thinner Instructions: denies Aspirin Instructions:denies   ERAS Protcol - NPO  COVID TEST- n/a    Anesthesia review: No  Your procedure is scheduled on Tuesday February 19, 2023  Report to [facility] Main Entrance "A" at  0820  A.M., then check in with the Admitting office.  Call this number if you have problems the morning of surgery:  [phone]   If you have any questions prior to your surgery date call [phone]: Open Monday-Friday 8am-4pm If you experience any cold or flu symptoms such as cough, fever, chills, shortness of breath, etc. between now and your scheduled surgery, please notify us at the above number     Remember:  Do not eat or drink after midnight the night before your surgery  Take these medicines the morning of surgery with A SIP OF WATER: None  As of today, STOP taking any Aspirin (unless otherwise instructed by your surgeon) Aleve, Naproxen, Ibuprofen, Motrin, Advil, Goody's, BC's, all herbal medications, fish oil, and all vitamins.

## 2023-02-18 NOTE — Progress Notes (Signed)
On hold with [employer] for 11 minutes, they do not have a [place] interpreter available today and they do not have the dialect of [place].

## 2023-02-19 ENCOUNTER — Encounter (HOSPITAL_COMMUNITY): Payer: Self-pay | Admitting: Neurological Surgery

## 2023-02-19 ENCOUNTER — Ambulatory Visit (HOSPITAL_COMMUNITY): Payer: BC Managed Care – PPO

## 2023-02-19 ENCOUNTER — Other Ambulatory Visit: Payer: Self-pay

## 2023-02-19 ENCOUNTER — Ambulatory Visit (HOSPITAL_COMMUNITY)
Admission: RE | Disposition: A | Discharge status: Home / Self Care | Payer: Self-pay | Source: Home / Self Care | Attending: Neurological Surgery

## 2023-02-19 ENCOUNTER — Ambulatory Visit (HOSPITAL_COMMUNITY): Payer: Self-pay

## 2023-02-19 ENCOUNTER — Observation Stay (HOSPITAL_COMMUNITY)
Admission: RE | Admit: 2023-02-19 | Discharge: 2023-02-20 | Disposition: A | Discharge status: Home / Self Care | Payer: BC Managed Care – PPO | Attending: Neurological Surgery | Admitting: Neurological Surgery

## 2023-02-19 DIAGNOSIS — M4712 Other spondylosis with myelopathy, cervical region: Secondary | ICD-10-CM | POA: Insufficient documentation

## 2023-02-19 DIAGNOSIS — M4802 Spinal stenosis, cervical region: Secondary | ICD-10-CM | POA: Diagnosis not present

## 2023-02-19 DIAGNOSIS — M5001 Cervical disc disorder with myelopathy,  high cervical region: Principal | ICD-10-CM | POA: Insufficient documentation

## 2023-02-19 HISTORY — DX: Other specified health status: Z78.9

## 2023-02-19 HISTORY — PX: ANTERIOR CERVICAL DECOMP/DISCECTOMY FUSION: SHX1161

## 2023-02-19 LAB — TYPE AND SCREEN
ABO/RH(D): B POS
Antibody Screen: NEGATIVE

## 2023-02-19 LAB — CBC
HCT: 46.6 % (ref 39.0–52.0)
Hemoglobin: 15.7 g/dL (ref 13.0–17.0)
MCH: 25.6 pg — ABNORMAL LOW (ref 26.0–34.0)
MCHC: 33.7 g/dL (ref 30.0–36.0)
MCV: 76 fL — ABNORMAL LOW (ref 80.0–100.0)
Platelets: 254 10*3/uL (ref 150–400)
RBC: 6.13 MIL/uL — ABNORMAL HIGH (ref 4.22–5.81)
RDW: 15.4 % (ref 11.5–15.5)
WBC: 8.7 10*3/uL (ref 4.0–10.5)
nRBC: 0 % (ref 0.0–0.2)

## 2023-02-19 LAB — ABO/RH: ABO/RH(D): B POS

## 2023-02-19 SURGERY — ANTERIOR CERVICAL DECOMPRESSION/DISCECTOMY FUSION 1 LEVEL
Anesthesia: General | Site: Spine Cervical

## 2023-02-19 MED ORDER — PHENYLEPHRINE HCL-NACL 20-0.9 MG/250ML-% IV SOLN
INTRAVENOUS | Status: DC | PRN
  Administered 2023-02-19: 30 ug/min via INTRAVENOUS

## 2023-02-19 MED ORDER — CHLORHEXIDINE GLUCONATE 0.12 % MT SOLN
OROMUCOSAL | Status: AC
  Filled 2023-02-19: qty 15

## 2023-02-19 MED ORDER — LIDOCAINE 2% (20 MG/ML) 5 ML SYRINGE
INTRAMUSCULAR | Status: AC
  Filled 2023-02-19: qty 5

## 2023-02-19 MED ORDER — DEXMEDETOMIDINE HCL IN NACL 80 MCG/20ML IV SOLN
INTRAVENOUS | Status: AC
  Filled 2023-02-19: qty 20

## 2023-02-19 MED ORDER — EPHEDRINE 5 MG/ML INJ
INTRAVENOUS | Status: AC
  Filled 2023-02-19: qty 5

## 2023-02-19 MED ORDER — 0.9 % SODIUM CHLORIDE (POUR BTL) OPTIME
TOPICAL | Status: DC | PRN
  Administered 2023-02-19: 1000 mL

## 2023-02-19 MED ORDER — CHLORHEXIDINE GLUCONATE CLOTH 2 % EX PADS: 6.0000 | MEDICATED_PAD | Freq: Once | CUTANEOUS | Status: DC

## 2023-02-19 MED ORDER — MIDAZOLAM HCL 2 MG/2ML IJ SOLN
INTRAMUSCULAR | Status: DC | PRN
  Administered 2023-02-19: 2 mg via INTRAVENOUS

## 2023-02-19 MED ORDER — ONDANSETRON HCL 4 MG/2ML IJ SOLN: 4.0000 mg | Freq: Four times a day (QID) | INTRAMUSCULAR | Status: DC | PRN

## 2023-02-19 MED ORDER — SODIUM CHLORIDE 0.9% FLUSH: 3.0000 mL | INTRAVENOUS | Status: DC | PRN

## 2023-02-19 MED ORDER — SODIUM CHLORIDE 0.9 % IV SOLN: 250.0000 mL | INTRAVENOUS | Status: DC

## 2023-02-19 MED ORDER — THROMBIN (RECOMBINANT) 5000 UNITS EX SOLR
CUTANEOUS | Status: DC | PRN
  Administered 2023-02-19: 10 mL via TOPICAL

## 2023-02-19 MED ORDER — ROCURONIUM BROMIDE 10 MG/ML (PF) SYRINGE
PREFILLED_SYRINGE | INTRAVENOUS | Status: AC
  Filled 2023-02-19: qty 10

## 2023-02-19 MED ORDER — MIDAZOLAM HCL 2 MG/2ML IJ SOLN
INTRAMUSCULAR | Status: AC
  Filled 2023-02-19: qty 2

## 2023-02-19 MED ORDER — ONDANSETRON HCL 4 MG PO TABS: 4.0000 mg | ORAL_TABLET | Freq: Four times a day (QID) | ORAL | Status: DC | PRN

## 2023-02-19 MED ORDER — ROCURONIUM BROMIDE 10 MG/ML (PF) SYRINGE
PREFILLED_SYRINGE | INTRAVENOUS | Status: DC | PRN
  Administered 2023-02-19: 60 mg via INTRAVENOUS
  Administered 2023-02-19: 10 mg via INTRAVENOUS

## 2023-02-19 MED ORDER — AMISULPRIDE (ANTIEMETIC) 5 MG/2ML IV SOLN: 10.0000 mg | Freq: Once | INTRAVENOUS | Status: DC | PRN

## 2023-02-19 MED ORDER — SODIUM CHLORIDE 0.9% FLUSH: 3.0000 mL | Freq: Two times a day (BID) | INTRAVENOUS | Status: DC

## 2023-02-19 MED ORDER — KETAMINE HCL 10 MG/ML IJ SOLN
INTRAMUSCULAR | Status: DC | PRN
Start: 2023-02-19 — End: 2023-02-19
  Administered 2023-02-19: 30 mg via INTRAVENOUS
  Administered 2023-02-19: 20 mg via INTRAVENOUS

## 2023-02-19 MED ORDER — HYDROCODONE-ACETAMINOPHEN 5-325 MG PO TABS
1.0000 | ORAL_TABLET | ORAL | Status: DC | PRN
  Administered 2023-02-19 – 2023-02-20 (×4): 1 via ORAL
  Filled 2023-02-19 (×4): qty 1

## 2023-02-19 MED ORDER — ESMOLOL HCL 100 MG/10ML IV SOLN
INTRAVENOUS | Status: DC | PRN
Start: 2023-02-19 — End: 2023-02-19
  Administered 2023-02-19: 20 mg via INTRAVENOUS

## 2023-02-19 MED ORDER — CEFAZOLIN SODIUM-DEXTROSE 2-4 GM/100ML-% IV SOLN
2.0000 g | Freq: Three times a day (TID) | INTRAVENOUS | Status: AC
  Administered 2023-02-19 – 2023-02-20 (×2): 2 g via INTRAVENOUS
  Filled 2023-02-19 (×2): qty 100

## 2023-02-19 MED ORDER — PHENOL 1.4 % MT LIQD: 1.0000 | OROMUCOSAL | Status: DC | PRN

## 2023-02-19 MED ORDER — CEFAZOLIN SODIUM-DEXTROSE 2-4 GM/100ML-% IV SOLN
2.0000 g | INTRAVENOUS | Status: AC
  Administered 2023-02-19: 2 g via INTRAVENOUS
  Filled 2023-02-19: qty 100

## 2023-02-19 MED ORDER — KETOROLAC TROMETHAMINE 15 MG/ML IJ SOLN
15.0000 mg | Freq: Four times a day (QID) | INTRAMUSCULAR | Status: DC
  Administered 2023-02-19 – 2023-02-20 (×3): 15 mg via INTRAVENOUS
  Filled 2023-02-19 (×3): qty 1

## 2023-02-19 MED ORDER — POLYETHYLENE GLYCOL 3350 17 G PO PACK: 17.0000 g | PACK | Freq: Every day | ORAL | Status: DC | PRN

## 2023-02-19 MED ORDER — LIDOCAINE 2% (20 MG/ML) 5 ML SYRINGE
INTRAMUSCULAR | Status: DC | PRN
  Administered 2023-02-19: 60 mg via INTRAVENOUS

## 2023-02-19 MED ORDER — FLEET ENEMA RE ENEM: 1.0000 | ENEMA | Freq: Once | RECTAL | Status: DC | PRN

## 2023-02-19 MED ORDER — SODIUM CHLORIDE 0.9 % IV SOLN
INTRAVENOUS | Status: DC | PRN
Start: 2023-02-19 — End: 2023-02-19

## 2023-02-19 MED ORDER — DEXAMETHASONE SODIUM PHOSPHATE 10 MG/ML IJ SOLN
INTRAMUSCULAR | Status: DC | PRN
  Administered 2023-02-19: 10 mg via INTRAVENOUS

## 2023-02-19 MED ORDER — MENTHOL 3 MG MT LOZG
1.0000 | LOZENGE | OROMUCOSAL | Status: DC | PRN
  Filled 2023-02-19: qty 9

## 2023-02-19 MED ORDER — OXYCODONE HCL 5 MG PO TABS: 5.0000 mg | ORAL_TABLET | Freq: Once | ORAL | Status: DC | PRN

## 2023-02-19 MED ORDER — CHLORHEXIDINE GLUCONATE 0.12 % MT SOLN
15.0000 mL | Freq: Once | OROMUCOSAL | Status: AC
  Administered 2023-02-19: 15 mL via OROMUCOSAL

## 2023-02-19 MED ORDER — HYDROCODONE-ACETAMINOPHEN 5-325 MG PO TABS
2.0000 | ORAL_TABLET | ORAL | Status: DC | PRN
  Administered 2023-02-20: 2 via ORAL
  Filled 2023-02-19: qty 2

## 2023-02-19 MED ORDER — ONDANSETRON HCL 4 MG/2ML IJ SOLN
INTRAMUSCULAR | Status: AC
  Filled 2023-02-19: qty 2

## 2023-02-19 MED ORDER — METHOCARBAMOL 500 MG PO TABS
500.0000 mg | ORAL_TABLET | Freq: Four times a day (QID) | ORAL | Status: DC | PRN
  Administered 2023-02-19 – 2023-02-20 (×3): 500 mg via ORAL
  Filled 2023-02-19 (×3): qty 1

## 2023-02-19 MED ORDER — SUGAMMADEX SODIUM 200 MG/2ML IV SOLN
INTRAVENOUS | Status: DC | PRN
  Administered 2023-02-19: 200 mg via INTRAVENOUS

## 2023-02-19 MED ORDER — KETAMINE HCL 50 MG/5ML IJ SOSY
PREFILLED_SYRINGE | INTRAMUSCULAR | Status: AC
  Filled 2023-02-19: qty 5

## 2023-02-19 MED ORDER — FENTANYL CITRATE (PF) 100 MCG/2ML IJ SOLN: 25.0000 ug | INTRAMUSCULAR | Status: DC | PRN

## 2023-02-19 MED ORDER — ACETAMINOPHEN 650 MG RE SUPP: 650.0000 mg | RECTAL | Status: DC | PRN

## 2023-02-19 MED ORDER — THROMBIN 5000 UNITS EX SOLR
OROMUCOSAL | Status: DC | PRN
  Administered 2023-02-19: 5 mL via TOPICAL

## 2023-02-19 MED ORDER — ACETAMINOPHEN 10 MG/ML IV SOLN: 1000.0000 mg | Freq: Once | INTRAVENOUS | Status: DC | PRN

## 2023-02-19 MED ORDER — FENTANYL CITRATE (PF) 250 MCG/5ML IJ SOLN
INTRAMUSCULAR | Status: DC | PRN
  Administered 2023-02-19: 50 ug via INTRAVENOUS
  Administered 2023-02-19: 150 ug via INTRAVENOUS
  Administered 2023-02-19: 50 ug via INTRAVENOUS

## 2023-02-19 MED ORDER — HYDROMORPHONE HCL 1 MG/ML IJ SOLN: 0.5000 mg | INTRAMUSCULAR | Status: DC | PRN

## 2023-02-19 MED ORDER — PROPOFOL 10 MG/ML IV BOLUS
INTRAVENOUS | Status: AC
  Filled 2023-02-19: qty 20

## 2023-02-19 MED ORDER — THROMBIN 5000 UNITS EX SOLR
CUTANEOUS | Status: AC
  Filled 2023-02-19: qty 15000

## 2023-02-19 MED ORDER — METHOCARBAMOL 1000 MG/10ML IJ SOLN: 500.0000 mg | Freq: Four times a day (QID) | INTRAVENOUS | Status: DC | PRN

## 2023-02-19 MED ORDER — PROMETHAZINE HCL 25 MG/ML IJ SOLN: 6.2500 mg | INTRAMUSCULAR | Status: DC | PRN

## 2023-02-19 MED ORDER — ACETAMINOPHEN 325 MG PO TABS: 650.0000 mg | ORAL_TABLET | ORAL | Status: DC | PRN

## 2023-02-19 MED ORDER — ONDANSETRON HCL 4 MG/2ML IJ SOLN
INTRAMUSCULAR | Status: DC | PRN
  Administered 2023-02-19: 4 mg via INTRAVENOUS

## 2023-02-19 MED ORDER — FENTANYL CITRATE (PF) 250 MCG/5ML IJ SOLN
INTRAMUSCULAR | Status: AC
  Filled 2023-02-19: qty 5

## 2023-02-19 MED ORDER — LACTATED RINGERS IV SOLN: INTRAVENOUS | Status: DC

## 2023-02-19 MED ORDER — DOCUSATE SODIUM 100 MG PO CAPS
100.0000 mg | ORAL_CAPSULE | Freq: Two times a day (BID) | ORAL | Status: DC
  Administered 2023-02-19 – 2023-02-20 (×2): 100 mg via ORAL
  Filled 2023-02-19 (×2): qty 1

## 2023-02-19 MED ORDER — DEXMEDETOMIDINE HCL IN NACL 200 MCG/50ML IV SOLN
INTRAVENOUS | Status: DC | PRN
  Administered 2023-02-19 (×2): 12 ug via INTRAVENOUS

## 2023-02-19 MED ORDER — PROPOFOL 1000 MG/100ML IV EMUL
INTRAVENOUS | Status: AC
  Filled 2023-02-19: qty 200

## 2023-02-19 MED ORDER — ORAL CARE MOUTH RINSE: 15.0000 mL | Freq: Once | OROMUCOSAL | Status: AC

## 2023-02-19 MED ORDER — OXYCODONE HCL 5 MG/5ML PO SOLN: 5.0000 mg | Freq: Once | ORAL | Status: DC | PRN

## 2023-02-19 MED ORDER — PROPOFOL 10 MG/ML IV BOLUS
INTRAVENOUS | Status: DC | PRN
  Administered 2023-02-19: 200 mg via INTRAVENOUS
  Administered 2023-02-19: 75 ug/kg/min via INTRAVENOUS

## 2023-02-19 MED ORDER — DEXAMETHASONE SODIUM PHOSPHATE 10 MG/ML IJ SOLN
INTRAMUSCULAR | Status: AC
  Filled 2023-02-19: qty 1

## 2023-02-19 SURGICAL SUPPLY — 56 items
APL SKNCLS STERI-STRIP NONHPOA (GAUZE/BANDAGES/DRESSINGS) ×1
BAG COUNTER SPONGE SURGICOUNT (BAG) ×2 IMPLANT
BAG SPNG CNTER NS LX DISP (BAG) ×1
BENZOIN TINCTURE PRP APPL 2/3 (GAUZE/BANDAGES/DRESSINGS) IMPLANT
BIT DRILL NEURO 2X3.1 SFT TUCH (MISCELLANEOUS) ×2 IMPLANT
BLADE CLIPPER SURG (BLADE) IMPLANT
BUR CARBIDE MATCH 3.0 (BURR) ×2 IMPLANT
CANISTER SUCT 3000ML PPV (MISCELLANEOUS) ×2 IMPLANT
CLSR STERI-STRIP ANTIMIC 1/2X4 (GAUZE/BANDAGES/DRESSINGS) IMPLANT
COVER MAYO STAND STRL (DRAPES) ×2 IMPLANT
DEVICE ENDSKLTN IMPL 16X14X7X6 (Cage) IMPLANT
DRAPE C-ARM 42X72 X-RAY (DRAPES) ×2 IMPLANT
DRAPE HALF SHEET 40X57 (DRAPES) IMPLANT
DRAPE LAPAROTOMY 100X72X124 (DRAPES) ×2 IMPLANT
DRAPE MICROSCOPE SLANT 54X150 (MISCELLANEOUS) ×2 IMPLANT
DRILL NEURO 2X3.1 SOFT TOUCH (MISCELLANEOUS) ×1
DRSG OPSITE POSTOP 4X6 (GAUZE/BANDAGES/DRESSINGS) IMPLANT
DURAPREP 6ML APPLICATOR 50/CS (WOUND CARE) ×2 IMPLANT
ELECT COATED BLADE 2.86 ST (ELECTRODE) ×2 IMPLANT
ELECT REM PT RETURN 9FT ADLT (ELECTROSURGICAL) ×1
ELECTRODE REM PT RTRN 9FT ADLT (ELECTROSURGICAL) ×2 IMPLANT
ENDOSKELETON IMPLANT 16X14X7X6 (Cage) ×1 IMPLANT
EVACUATOR 1/8 PVC DRAIN (DRAIN) IMPLANT
GAUZE 4X4 16PLY ~~LOC~~+RFID DBL (SPONGE) IMPLANT
GLOVE BIO SURGEON STRL SZ7 (GLOVE) ×2 IMPLANT
GLOVE BIOGEL PI IND STRL 7.5 (GLOVE) ×2 IMPLANT
GLOVE BIOGEL PI IND STRL 8 (GLOVE) ×2 IMPLANT
GLOVE ECLIPSE 8.0 STRL XLNG CF (GLOVE) ×4 IMPLANT
GLOVE EXAM NITRILE LRG STRL (GLOVE) IMPLANT
GLOVE EXAM NITRILE XL STR (GLOVE) IMPLANT
GLOVE EXAM NITRILE XS STR PU (GLOVE) IMPLANT
GOWN STRL REUS W/ TWL LRG LVL3 (GOWN DISPOSABLE) IMPLANT
GOWN STRL REUS W/ TWL XL LVL3 (GOWN DISPOSABLE) ×4 IMPLANT
GOWN STRL REUS W/TWL 2XL LVL3 (GOWN DISPOSABLE) IMPLANT
GOWN STRL REUS W/TWL LRG LVL3 (GOWN DISPOSABLE)
GOWN STRL REUS W/TWL XL LVL3 (GOWN DISPOSABLE) ×1
HEMOSTAT POWDER KIT SURGIFOAM (HEMOSTASIS) ×2 IMPLANT
KIT BASIN OR (CUSTOM PROCEDURE TRAY) ×2 IMPLANT
KIT TURNOVER KIT B (KITS) ×2 IMPLANT
NDL SPNL 18GX3.5 QUINCKE PK (NEEDLE) ×2 IMPLANT
NEEDLE SPNL 18GX3.5 QUINCKE PK (NEEDLE) ×1 IMPLANT
NS IRRIG 1000ML POUR BTL (IV SOLUTION) ×2 IMPLANT
PACK LAMINECTOMY NEURO (CUSTOM PROCEDURE TRAY) ×2 IMPLANT
PAD ARMBOARD 7.5X6 YLW CONV (MISCELLANEOUS) ×6 IMPLANT
PIN DISTRACTION 14MM (PIN) ×4 IMPLANT
PLATE ZEVO 1LVL 19MM (Plate) IMPLANT
PUTTY DBF 1CC CORTICAL FIBERS (Putty) IMPLANT
SCREW 3.5 SELFDRILL 17MM VARI (Screw) IMPLANT
SPONGE INTESTINAL PEANUT (DISPOSABLE) ×2 IMPLANT
SPONGE SURGIFOAM ABS GEL SZ50 (HEMOSTASIS) ×2 IMPLANT
STAPLER VISISTAT 35W (STAPLE) IMPLANT
STRIP CLOSURE SKIN 1/2X4 (GAUZE/BANDAGES/DRESSINGS) ×2 IMPLANT
TAPE SURG TRANSPORE 1 IN (GAUZE/BANDAGES/DRESSINGS) ×2 IMPLANT
TOWEL GREEN STERILE (TOWEL DISPOSABLE) ×2 IMPLANT
TOWEL GREEN STERILE FF (TOWEL DISPOSABLE) ×2 IMPLANT
WATER STERILE IRR 1000ML POUR (IV SOLUTION) ×2 IMPLANT

## 2023-02-19 NOTE — Anesthesia Procedure Notes (Addendum)
Procedure Name: Intubation Date/Time: 02/19/2023 11:07 AM  Performed by: Pincus Large, CRNAPre-anesthesia Checklist: Patient identified, Emergency Drugs available, Suction available and Patient being monitored Patient Re-evaluated:Patient Re-evaluated prior to induction Oxygen Delivery Method: Circle System Utilized Preoxygenation: Pre-oxygenation with 100% oxygen Induction Type: IV induction Ventilation: Mask ventilation without difficulty Laryngoscope Size: Glidescope and 3 Grade View: Grade I Tube type: Oral Number of attempts: 1 Airway Equipment and Method: Stylet and Oral airway Placement Confirmation: ETT inserted through vocal cords under direct vision, positive ETCO2 and breath sounds checked- equal and bilateral Tube secured with: Tape Dental Injury: Teeth and Oropharynx as per pre-operative assessment

## 2023-02-19 NOTE — Op Note (Signed)
Providing Compassionate, Quality Care - Together  Date of service: 02/19/2023  PREOP DIAGNOSIS: Cervical stenosis with myelopathy, C3-4  POSTOP DIAGNOSIS: Same  PROCEDURE: 1. Arthrodesis C3-4, anterior interbody technique  2. Placement of intervertebral biomechanical device C3-4 medtronic titan interbody 75mmx14mm 3. Placement of anterior instrumentation consisting of interbody plate and screws - C3-4, medtronic zevo with 17mm screws bilateral at C3 and C4 4. Discectomy at C3-4 for decompression of spinal cord and exiting nerve roots  5. Use of morselized bone allograft  6. Use of intraoperative microscope 7.  Use of autograft, same incision  SURGEON: Dr. Monia Pouch, DO  ASSISTANT: Patrici Ranks, PA  ANESTHESIA: General Endotracheal  EBL: 20cc  SPECIMENS: None  DRAINS: None  COMPLICATIONS: None immediate  CONDITION: Hemodynamically stable to PACU  HISTORY: Timothy Barker is a 37 y.o. y.o. male who initially presented with progressive numbness, tingling and balance difficulties.  MRI of the cervical spine revealed severe canal stenosis bilaterally at C3-4 with cord compression.  Due to his progressive symptoms and his difficulty walking, I recommended surgical intervention in the form of an ACDF C3-4.  All risks, benefits and expected outcomes as well as alternatives to treatment were discussed and agreed upon. After all questions were answered, informed consent was obtained with a translator.  PROCEDURE IN DETAIL: The patient was brought to the operating room and transferred to the operative table. After induction of general anesthesia, the patient was positioned on the operative table in the supine position with all pressure points meticulously padded. The skin of the neck was then prepped and draped in the usual sterile fashion.  Physician driven timeout was performed.  After timeout was conducted, skin incision was then made sharply with a 10 blade and Bovie electrocautery  was used to dissect the subcutaneous tissue until the platysma was identified. The platysma was then divided and undermined. The sternocleidomastoid muscle was then identified and, utilizing natural fascial planes in the neck, the prevertebral fascia was identified and the carotid sheath was retracted laterally and the trachea and esophagus retracted medially. Again using fluoroscopy, the correct disc space was identified. Bovie electrocautery was used to dissect in the subperiosteal plane and elevate the bilateral longus coli muscles. Self-retaining retractors were then placed under the longus coli muscles bilaterally. At this point, the microscope was draped and brought into the field, and the remainder of the case was done under the microscope using microdissecting technique.  Distraction pins were placed in midline above and below the disc space.  The disc  space was placed in distraction.  The disc space was incised sharply and rongeurs were use to initially complete a discectomy.  Anterior osteophytes were removed and kept for autograft using rongeurs.  The high-speed drill was then used to complete discectomy until the posterior annulus was identified and removed and the posterior longitudinal ligament was identified. Using microcurettes, the PLL was elevated, and Kerrison rongeurs were used to remove the posterior longitudinal ligament and the ventral thecal sac was identified. Using a combination of curettes and ronguers, complete decompression of the thecal sac and exiting nerve roots at this level was completed, and verified using micro-nerve hook. The disc space was taken out of distraction.  Epidural hemostasis was achieved with Surgifoam.  Having completed our decompression, attention was turned to placement of the intervertebral device. Trial spacers were used to select a 7 mm graft. This graft was then filled with autograft and morcellized allograft, and inserted under live fluoroscopy.  After  placement of the intervertebral device, the above anterior cervical plate was selected, and placed across the interspace. Using a high-speed drill, the cortex of the cervical vertebral bodies was punctured, and screws inserted in the level above and below. Final fluoroscopic images in AP and lateral projections were taken to confirm good hardware placement.  The plate was final tightened to the manufacturer's recommendation and the screws were locked in place.  At this point, after all counts were verified to be correct, meticulous hemostasis was secured using a combination of bipolar electrocautery and passive hemostatics.  Skin was closed with staples.  Sterile dressing was applied.  The patient tolerated the procedure well and was extubated in the room and taken to the postanesthesia care unit in stable condition.

## 2023-02-19 NOTE — H&P (Signed)
Providing Compassionate, Quality Care - Together  NEUROSURGERY HISTORY & PHYSICAL   Reuben ZAKARI SPRAU is an 37 y.o. male.   Chief Complaint: numbness and tingling HPI: This is a 37 year old male with complaints of worsening neck pain, numbness tingling and balance difficulty.  Went to the emergency department and was found to have upon workup MRI of the cervical spine that showed severe canal stenosis due to bilateral disc osteophyte complex at C3-4.  His symptomatology has been progressive and therefore I recommended surgical intervention.  He is present with a Nurse, learning disability, interview was completed with a Nurse, learning disability.  Past Medical History:  Diagnosis Date   Medical history non-contributory     Past Surgical History:  Procedure Laterality Date   ABDOMINAL SURGERY      Family History  Problem Relation Age of Onset   Healthy Mother    Healthy Father    Social History:  reports that he has never smoked. He has never used smokeless tobacco. He reports that he does not drink alcohol and does not use drugs.  Allergies: No Known Allergies  No medications prior to admission.    Results for orders placed or performed during the hospital encounter of 02/19/23 (from the past 48 hour(s))  Type and screen     Status: None (Preliminary result)   Collection Time: 02/19/23  8:50 AM  Result Value Ref Range   ABO/RH(D) PENDING    Antibody Screen PENDING    Sample Expiration      02/22/2023,2359 Performed at [facility], [address] [place], [place] [address]   CBC per protocol     Status: Abnormal   Collection Time: 02/19/23  8:55 AM  Result Value Ref Range   WBC 8.7 4.0 - 10.5 K/uL   RBC 6.13 (H) 4.22 - 5.81 MIL/uL   Hemoglobin 15.7 13.0 - 17.0 g/dL   HCT 51.7 61.6 - 07.3 %   MCV 76.0 (L) 80.0 - 100.0 fL   MCH 25.6 (L) 26.0 - 34.0 pg   MCHC 33.7 30.0 - 36.0 g/dL   RDW 71.0 62.6 - 94.8 %   Platelets 254 150 - 400 K/uL   nRBC 0.0 0.0 - 0.2 %    Comment: Performed at [facility], [address] [place], [place] [address]  ABO/Rh     Status: None   Collection Time: 02/19/23  8:55 AM  Result Value Ref Range   ABO/RH(D)      B POS Performed at [facility], [address] [place], [place] [address]    No results found.  ROS All pertinent positives and negatives are listed in HPI above  Blood pressure (!) 147/91, pulse 100, temperature 98.8 F (37.1 C), temperature source Oral, resp. rate 18, height 5\' 5"  (1.651 m), weight 72.6 kg, SpO2 98%.  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  We discussed all risks, benefits and expected outcomes as well as alternatives to treatment.  Informed consent was obtained and witnessed with translator.  Answered all of his questions as well as his brother's questions.   Thank you for allowing me to participate in this patient's care.  Please do  not hesitate to call with questions or concerns.   Monia Pouch, DO Neurosurgeon [facility] [phone]

## 2023-02-19 NOTE — Anesthesia Postprocedure Evaluation (Signed)
Anesthesia Post Note  Patient: Timothy Barker  Procedure(s) Performed: ANTERIOR CERVICAL DECOMPRESSION/DISCECTOMY FUSION, CERVICAL THREE- CERVICAL FOUR (Spine Cervical)     Patient location during evaluation: PACU Anesthesia Type: General Level of consciousness: awake and alert Pain management: pain level controlled Vital Signs Assessment: post-procedure vital signs reviewed and stable Respiratory status: spontaneous breathing, nonlabored ventilation and respiratory function stable Cardiovascular status: blood pressure returned to baseline and stable Postop Assessment: no apparent nausea or vomiting Anesthetic complications: no   No notable events documented.  Last Vitals:  Vitals:   02/19/23 1415 02/19/23 1432  BP: 131/85 (!) 143/93  Pulse: 96 92  Resp: 14 20  Temp: 36.8 C   SpO2: 96% 97%    Last Pain:  Vitals:   02/19/23 1315  TempSrc:   PainSc: 0-No pain   Pain Goal: Patients Stated Pain Goal: 0 (02/19/23 0824)  LLE Motor Response: Purposeful movement (02/19/23 1415) LLE Sensation: Numbness (02/19/23 1415) RLE Motor Response: Purposeful movement (02/19/23 1415) RLE Sensation: Numbness (02/19/23 1415)        Lowella Curb

## 2023-02-19 NOTE — Transfer of Care (Signed)
Immediate Anesthesia Transfer of Care Note  Patient: Timothy Barker  Procedure(s) Performed: ANTERIOR CERVICAL DECOMPRESSION/DISCECTOMY FUSION, CERVICAL THREE- CERVICAL FOUR (Spine Cervical)  Patient Location: PACU  Anesthesia Type:General  Level of Consciousness: sedated and drowsy  Airway & Oxygen Therapy: Patient Spontanous Breathing and Patient connected to face mask oxygen  Post-op Assessment: Report given to RN and Post -op Vital signs reviewed and stable  Post vital signs: Reviewed and stable  Last Vitals:  Vitals Value Taken Time  BP 106/63 02/19/23 1253  Temp    Pulse 76 02/19/23 1256  Resp 14 02/19/23 1256  SpO2 98 % 02/19/23 1256  Vitals shown include unfiled device data.  Last Pain:  Vitals:   02/19/23 0824  TempSrc:   PainSc: 8       Patients Stated Pain Goal: 0 (02/19/23 0824)  Complications: No notable events documented.

## 2023-02-19 NOTE — Anesthesia Preprocedure Evaluation (Addendum)
Anesthesia Evaluation  Patient identified by MRN, date of birth, ID band Patient awake    Reviewed: Allergy & Precautions, NPO status , Patient's Chart, lab work & pertinent test results  Airway Mallampati: II  TM Distance: >3 FB Neck ROM: Limited    Dental no notable dental hx.    Pulmonary neg pulmonary ROS   Pulmonary exam normal        Cardiovascular negative cardio ROS Normal cardiovascular exam     Neuro/Psych  negative psych ROS   GI/Hepatic negative GI ROS, Neg liver ROS,,,  Endo/Other  negative endocrine ROS    Renal/GU negative Renal ROS     Musculoskeletal negative musculoskeletal ROS (+)    Abdominal   Peds  Hematology negative hematology ROS (+)   Anesthesia Other Findings CERVICAL MYELOPATHY  Reproductive/Obstetrics                             Anesthesia Physical Anesthesia Plan  ASA: 2  Anesthesia Plan: General   Post-op Pain Management:    Induction: Intravenous  PONV Risk Score and Plan: 2 and Ondansetron, Dexamethasone, Midazolam and Treatment may vary due to age or medical condition  Airway Management Planned: Oral ETT and Video Laryngoscope Planned  Additional Equipment:   Intra-op Plan:   Post-operative Plan: Extubation in OR  Informed Consent: I have reviewed the patients History and Physical, chart, labs and discussed the procedure including the risks, benefits and alternatives for the proposed anesthesia with the patient or authorized representative who has indicated his/her understanding and acceptance.     Dental advisory given and Interpreter used for interveiw  Plan Discussed with: CRNA  Anesthesia Plan Comments:        Anesthesia Quick Evaluation

## 2023-02-19 NOTE — Progress Notes (Signed)
Orthopedic Tech Progress Note Patient Details:  Timothy Barker [birth year] [id number] Dropped off aspen cervical collar per order.  Ortho Devices Type of Ortho Device: Aspen cervical collar Ortho Device/Splint Interventions: Desmond Dike 02/19/2023, 2:58 PM

## 2023-02-20 ENCOUNTER — Encounter (HOSPITAL_COMMUNITY): Payer: Self-pay | Admitting: Neurological Surgery

## 2023-02-20 DIAGNOSIS — M5001 Cervical disc disorder with myelopathy,  high cervical region: Secondary | ICD-10-CM | POA: Diagnosis not present

## 2023-02-20 MED ORDER — METHOCARBAMOL 500 MG PO TABS: 500.0000 mg | ORAL_TABLET | Freq: Three times a day (TID) | ORAL | 0 refills | Status: AC | PRN

## 2023-02-20 MED ORDER — HYDROCODONE-ACETAMINOPHEN 5-325 MG PO TABS: 1.0000 | ORAL_TABLET | ORAL | 0 refills | Status: AC | PRN

## 2023-02-20 MED FILL — Thrombin For Soln 5000 Unit: CUTANEOUS | Qty: 2 | Status: AC

## 2023-02-20 NOTE — Discharge Summary (Signed)
  Patient ID: ZACARY DELOE MRN: [id number] DOB/AGE: April 29, 1986 37 y.o.  Admit date: 02/19/2023 Discharge date: 02/20/2023  Admission Diagnoses: Cervical spinal stenosis  Discharge Diagnoses: Same   Discharged Condition: Stable  Hospital Course:  Timothy Barker is a 37 y.o. male who was admitted following an uncomplicated ACDF C3-4. They were recovered in PACU and transferred to [facility]. Hospital course was uncomplicated. Pt stable for discharge today. Pt to f/u in office for routine post op visit. Pt is in agreement w/ plan.    Discharge Exam: Blood pressure 132/85, pulse 98, temperature 98.2 F (36.8 C), temperature source Oral, resp. rate 20, height 5\' 5"  (1.651 m), weight 72.6 kg, SpO2 98%. A&O x3 Speech fluent, appropriate CN grossly intact Strength 5/5 x4.  SILTx4.  Dressing c/d/I.   Disposition: Discharge disposition: 01-Home or Self Care       Discharge Instructions     Incentive spirometry RT   Complete by: As directed       Allergies as of 02/20/2023   No Known Allergies      Medication List     TAKE these medications    HYDROcodone-acetaminophen 5-325 MG tablet Commonly known as: NORCO/VICODIN Take 1 tablet by mouth every 4 (four) hours as needed for moderate pain ((score 4 to 6)).   methocarbamol 500 MG tablet Commonly known as: ROBAXIN Take 1 tablet (500 mg total) by mouth every 8 (eight) hours as needed for muscle spasms.         Signed: Clovis Riley 02/20/2023, 10:25 AM

## 2023-02-20 NOTE — Progress Notes (Signed)
PT Cancellation Note and Discharge  Patient Details Name: Timothy Barker MRN: [id number] DOB: 30-Oct-1985   Cancelled Treatment:    Reason Eval/Treat Not Completed: PT screened, no needs identified, will sign off. Discussed pt case with OT who reports pt is currently mobilizing without assistance and does not require a formal PT evaluation at this time. PT signing off. If needs change, please reconsult.     Marylynn Pearson 02/20/2023, 9:35 AM  Conni Slipper, PT, DPT Acute Rehabilitation Services Secure Chat Preferred Office: [phone]

## 2023-02-20 NOTE — Plan of Care (Signed)
Pt doing well. Pt given D/C instructions via interpreter on computer. Pt verbalized understanding. Rx's were sent to the pharmacy by MD. Pt's incision is clean and dry with no sign of infection. Pt's IV was removed prior to D/C. Pt D/C'd home via walking per MD order. Pt is stable @ D/C and has no other needs at this time. Rema Fendt, RN

## 2023-02-20 NOTE — Evaluation (Signed)
Occupational Therapy Evaluation Patient Details Name: Timothy Barker MRN: [id number] DOB: Mar 26, 1986 Today's Date: 02/20/2023   History of Present Illness 37 yo male s/p 9/10 ACDF C3-4 PMH abdominal surg   Clinical Impression   Patient evaluated by Occupational Therapy with no further acute OT needs identified. All education has been completed and the patient has no further questions. See below for any follow-up Occupational Therapy or equipment needs. OT to sign off. Thank you for referral.     Interpreter needed speaks "vietnamese montagnard rhade "- used video during session younger brother can help translate if unable to get a translator video call     If plan is discharge home, recommend the following:      Functional Status Assessment  Patient has had a recent decline in their functional status and demonstrates the ability to make significant improvements in function in a reasonable and predictable amount of time.  Equipment Recommendations  None recommended by OT    Recommendations for Other Services       Precautions / Restrictions Precautions Precautions: Cervical Required Braces or Orthoses: Cervical Brace Cervical Brace: Hard collar;At all times Restrictions Weight Bearing Restrictions: No      Mobility Bed Mobility Overal bed mobility: Independent                  Transfers Overall transfer level: Independent                        Balance Overall balance assessment: Independent                                         ADL either performed or assessed with clinical judgement   ADL Overall ADL's : Independent                                       General ADL Comments: educated on don doff ccollar and proper wear time.  Cervical precautions ( handout provided): Educated patient on don doff brace with return demonstration, educated on oral care using cups, washing face with cloth, never to wash directly on  incision site, avoid neck rotation flexion and extension, positioning with pillows in chair for bil UE, sleeping positioning, avoiding pushing / pulling with bil UE, and fine motor exercises ( handout provided).  Pt educated on need to notify doctor / RN of swallowing changes or choking..     Vision Baseline Vision/History: 0 No visual deficits Vision Assessment?: No apparent visual deficits     Perception         Praxis         Pertinent Vitals/Pain Pain Assessment Pain Assessment: Faces Faces Pain Scale: Hurts little more Pain Location: throat (swallowing) Pain Descriptors / Indicators: Sore Pain Intervention(s): Monitored during session, Limited activity within patient's tolerance     Extremity/Trunk Assessment Upper Extremity Assessment Upper Extremity Assessment: RUE deficits/detail;Right hand dominant;LUE deficits/detail RUE Sensation: decreased light touch RUE Coordination: decreased fine motor LUE Sensation: decreased light touch LUE Coordination: decreased fine motor   Lower Extremity Assessment Lower Extremity Assessment: Overall WFL for tasks assessed   Cervical / Trunk Assessment Cervical / Trunk Assessment: Neck Surgery   Communication Communication Communication: No apparent difficulties   Cognition Arousal: Alert Behavior During Therapy: WFL for tasks assessed/performed Overall  Cognitive Status: Within Functional Limits for tasks assessed                                       General Comments  incision dry and covered at this time. noted to have small area less than the size of a dime on the L lower corner of the bandage red in color    Exercises Exercises: Other exercises Other Exercises Other Exercises: fine motor exercises given- handout provided in english. confirmed with patient and translator that the brother can help with english written handout. Other Exercises: handout on cervical precautions with visual aides provided    Shoulder Instructions      Home Living Family/patient expects to be discharged to:: Private residence Living Arrangements: Alone Available Help at Discharge: Family;Available PRN/intermittently Type of Home: Mobile home       Home Layout: One level     Bathroom Shower/Tub: Producer, television/film/video: Standard     Home Equipment: None   Additional Comments: works in [employer]      Prior Functioning/Environment Prior Level of Function : Independent/Modified Independent;Working/employed                        OT Problem List:        OT Treatment/Interventions:      OT Goals(Current goals can be found in the care plan section)    OT Frequency:      Co-evaluation              AM-PAC OT "6 Clicks" Daily Activity     Outcome Measure Help from another person eating meals?: None Help from another person taking care of personal grooming?: None Help from another person toileting, which includes using toliet, bedpan, or urinal?: None Help from another person bathing (including washing, rinsing, drying)?: None Help from another person to put on and taking off regular upper body clothing?: None Help from another person to put on and taking off regular lower body clothing?: None 6 Click Score: 24   End of Session Equipment Utilized During Treatment: Cervical collar Nurse Communication: Mobility status;Precautions;Weight bearing status  Activity Tolerance: Patient tolerated treatment well Patient left: in bed;with call bell/phone within reach  OT Visit Diagnosis: Muscle weakness (generalized) (M62.81)                Time: 1610-9604 OT Time Calculation (min): 28 min Charges:  OT General Charges $OT Visit: 1 Visit OT Evaluation $OT Eval Moderate Complexity: 1 Mod   Brynn, OTR/L  Acute Rehabilitation Services Office: [phone] .   Mateo Flow 02/20/2023, 11:34 AM

## 2024-02-18 ENCOUNTER — Other Ambulatory Visit: Payer: Self-pay | Admitting: Surgery
